# Patient Record
Sex: Male | Born: 2000 | Race: Black or African American | Hispanic: No | Marital: Single
Health system: Southern US, Community
[De-identification: ages and names within clinical notes are randomized; demographics above are authoritative.]

## PROBLEM LIST (undated history)

## (undated) DIAGNOSIS — J45909 Unspecified asthma, uncomplicated: Secondary | ICD-10-CM

## (undated) HISTORY — PX: HERNIA REPAIR: SHX51

---
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
## 2023-03-29 ENCOUNTER — Emergency Department (HOSPITAL_COMMUNITY)
Admission: EM | Admit: 2023-03-29 | Discharge: 2023-03-29 | Discharge status: Against Medical Advice | Payer: MEDICAID | Attending: Emergency Medicine | Admitting: Emergency Medicine

## 2023-03-29 DIAGNOSIS — Z5321 Procedure and treatment not carried out due to patient leaving prior to being seen by health care provider: Secondary | ICD-10-CM | POA: Insufficient documentation

## 2023-03-29 DIAGNOSIS — Z113 Encounter for screening for infections with a predominantly sexual mode of transmission: Secondary | ICD-10-CM | POA: Diagnosis present

## 2023-06-06 ENCOUNTER — Emergency Department (HOSPITAL_COMMUNITY)
Admission: EM | Admit: 2023-06-06 | Discharge: 2023-06-07 | Disposition: A | Discharge status: Home / Self Care | Payer: MEDICAID | Attending: Emergency Medicine | Admitting: Emergency Medicine

## 2023-06-06 DIAGNOSIS — Z202 Contact with and (suspected) exposure to infections with a predominantly sexual mode of transmission: Secondary | ICD-10-CM | POA: Insufficient documentation

## 2023-06-06 MED ORDER — CEFTRIAXONE SODIUM 500 MG IJ SOLR
500.0000 mg | Freq: Once | INTRAMUSCULAR | Status: AC
  Administered 2023-06-07: 500 mg via INTRAMUSCULAR
  Filled 2023-06-06: qty 500

## 2023-06-06 MED ORDER — DOXYCYCLINE HYCLATE 100 MG PO TABS
100.0000 mg | ORAL_TABLET | Freq: Once | ORAL | Status: AC
  Administered 2023-06-07: 100 mg via ORAL
  Filled 2023-06-06: qty 1

## 2023-06-06 MED ORDER — DOXYCYCLINE HYCLATE 100 MG PO CAPS: 100.0000 mg | ORAL_CAPSULE | Freq: Two times a day (BID) | ORAL | 0 refills | Status: AC

## 2023-06-06 NOTE — ED Triage Notes (Signed)
 Pt states baby mamma texted him that she is positive for the clap. Needs treatment and requesting testing for everything. No symptoms

## 2023-06-07 LAB — RAPID HIV SCREEN (HIV 1/2 AB+AG)
HIV 1/2 Antibodies: NONREACTIVE
HIV-1 P24 Antigen - HIV24: NONREACTIVE

## 2023-06-07 LAB — RPR: RPR Ser Ql: NONREACTIVE

## 2023-06-07 NOTE — ED Provider Notes (Signed)
 [place] EMERGENCY DEPARTMENT AT [facility] Provider Note   CSN: [id number] Arrival date & time: 06/06/23  2143     History  Chief Complaint  Patient presents with   SEXUALLY TRANSMITTED DISEASE    Ricky Davila is a 23 y.o. male.  Exposed to chlamydia. No symptoms. Does have h/o an STD, not sure what.         Home Medications Prior to Admission medications   Medication Sig Start Date End Date Taking? Authorizing Provider  doxycycline  (VIBRAMYCIN ) 100 MG capsule Take 1 capsule (100 mg total) by mouth 2 (two) times daily. One po bid x 7 days 06/06/23  Yes Dakwon Wenberg, [place], MD  cetirizine  (ZYRTEC ) 1 MG/ML syrup Take 5 mLs (5 mg total) by mouth daily. Patient not taking: Reported on 01/01/2019 12/09/13 01/01/28  Levy Bone, DO  guaiFENesin  (ROBITUSSIN) 100 MG/5ML liquid Take 5-10 mLs (100-200 mg total) by mouth every 4 (four) hours as needed for cough. Patient not taking: Reported on 01/01/2019 07/24/18   Adah Corning A, NP  ibuprofen  (ADVIL ,MOTRIN ) 100 MG/5ML suspension Take 14.1 mLs (282 mg total) by mouth every 6 (six) hours as needed. Patient not taking: Reported on 01/01/2019 07/24/18   Adah Corning A, NP  ibuprofen  (CHILDRENS MOTRIN ) 100 MG/5ML suspension Take 17.9 mLs (358 mg total) by mouth every 6 (six) hours as needed for fever. Patient not taking: Reported on 01/01/2019 09/17/13   Rhae Lye, MD  sucralfate  (CARAFATE ) 1 GM/10ML suspension Give 3mLs by mouth three times daily as needed. Patient not taking: Reported on 01/01/2019 12/29/17   Lum Dorothyann RAMAN, NP      Allergies    Patient has no known allergies.    Review of Systems   Review of Systems  Physical Exam Updated Vital Signs BP (!) 114/44 (BP Location: Right Arm)   Pulse (!) 51   Temp 98.1 F (36.7 C) (Oral)   Resp 16   SpO2 100%  Physical Exam Vitals and nursing note reviewed.  Constitutional:      Appearance: He is well-developed.  HENT:     Head: Normocephalic and atraumatic.   Cardiovascular:     Rate and Rhythm: Normal rate.  Pulmonary:     Effort: Pulmonary effort is normal. No respiratory distress.  Abdominal:     General: There is no distension.  Genitourinary:    Penis: Normal.   Musculoskeletal:        General: Normal range of motion.     Cervical back: Normal range of motion.  Neurological:     Mental Status: He is alert.     ED Results / Procedures / Treatments   Labs (all labs ordered are listed, but only abnormal results are displayed) Labs Reviewed  RAPID HIV SCREEN (HIV 1/2 AB+AG)  RPR  GC/CHLAMYDIA PROBE AMP ([place]) NOT AT [facility]    EKG None  Radiology No results found.  Procedures Procedures    Medications Ordered in ED Medications  cefTRIAXone  (ROCEPHIN ) injection 500 mg (500 mg Intramuscular Given 06/07/23 0426)  doxycycline  (VIBRA -TABS) tablet 100 mg (100 mg Oral Given 06/07/23 0427)    ED Course/ Medical Decision Making/ A&P                                 Medical Decision Making Amount and/or Complexity of Data Reviewed Labs: ordered.  Risk Prescription drug management.   Tested. Will treat ppx for exposure although no symptoms  currently. Advised abstinence and completion of antibiotics despite results. Advised safe sex. Advised syphills/HIV are not being treated and to fu w/ ID clinic if those are positive.    Final Clinical Impression(s) / ED Diagnoses Final diagnoses:  STD exposure    Rx / DC Orders ED Discharge Orders          Ordered    doxycycline  (VIBRAMYCIN ) 100 MG capsule  2 times daily        06/06/23 2356              Madison Direnzo, [place], MD 06/07/23 [phone]

## 2023-06-08 LAB — GC/CHLAMYDIA PROBE AMP (~~LOC~~) NOT AT ARMC
Chlamydia: POSITIVE — AB
Comment: NEGATIVE
Comment: NORMAL
Neisseria Gonorrhea: POSITIVE — AB
